# Patient Record
Sex: Female | Born: 1970 | Race: Black or African American | Hispanic: No | Marital: Single | Smoking: Current every day smoker
Health system: Southern US, Community
[De-identification: ages and names within clinical notes are randomized; demographics above are authoritative.]

## PROBLEM LIST (undated history)

## (undated) DIAGNOSIS — B20 Human immunodeficiency virus [HIV] disease: Secondary | ICD-10-CM

---

## 2012-09-26 ENCOUNTER — Encounter (HOSPITAL_COMMUNITY): Payer: Self-pay | Admitting: Emergency Medicine

## 2012-09-26 ENCOUNTER — Emergency Department (HOSPITAL_COMMUNITY)
Admission: EM | Admit: 2012-09-26 | Discharge: 2012-09-26 | Disposition: A | Discharge status: Home / Self Care | Payer: Self-pay | Attending: Emergency Medicine | Admitting: Emergency Medicine

## 2012-09-26 ENCOUNTER — Emergency Department (HOSPITAL_COMMUNITY): Payer: Self-pay

## 2012-09-26 DIAGNOSIS — Z79899 Other long term (current) drug therapy: Secondary | ICD-10-CM | POA: Insufficient documentation

## 2012-09-26 DIAGNOSIS — F17200 Nicotine dependence, unspecified, uncomplicated: Secondary | ICD-10-CM | POA: Insufficient documentation

## 2012-09-26 DIAGNOSIS — K0889 Other specified disorders of teeth and supporting structures: Secondary | ICD-10-CM

## 2012-09-26 DIAGNOSIS — S8991XA Unspecified injury of right lower leg, initial encounter: Secondary | ICD-10-CM

## 2012-09-26 DIAGNOSIS — Y929 Unspecified place or not applicable: Secondary | ICD-10-CM | POA: Insufficient documentation

## 2012-09-26 DIAGNOSIS — Y939 Activity, unspecified: Secondary | ICD-10-CM | POA: Insufficient documentation

## 2012-09-26 DIAGNOSIS — S99929A Unspecified injury of unspecified foot, initial encounter: Secondary | ICD-10-CM | POA: Insufficient documentation

## 2012-09-26 DIAGNOSIS — S8990XA Unspecified injury of unspecified lower leg, initial encounter: Secondary | ICD-10-CM | POA: Insufficient documentation

## 2012-09-26 DIAGNOSIS — K089 Disorder of teeth and supporting structures, unspecified: Secondary | ICD-10-CM | POA: Insufficient documentation

## 2012-09-26 DIAGNOSIS — W19XXXA Unspecified fall, initial encounter: Secondary | ICD-10-CM | POA: Insufficient documentation

## 2012-09-26 MED ORDER — TRAMADOL HCL 50 MG PO TABS: 50.0000 mg | ORAL_TABLET | Freq: Four times a day (QID) | ORAL | Status: DC | PRN

## 2012-09-26 MED ORDER — TRAMADOL HCL 50 MG PO TABS
50.0000 mg | ORAL_TABLET | Freq: Once | ORAL | Status: AC
  Administered 2012-09-26: 50 mg via ORAL
  Filled 2012-09-26: qty 1

## 2012-09-26 MED ORDER — PENICILLIN V POTASSIUM 500 MG PO TABS: 500.0000 mg | ORAL_TABLET | Freq: Four times a day (QID) | ORAL | Status: DC

## 2012-09-26 NOTE — ED Provider Notes (Signed)
History     CSN: [id number]  Arrival date & time 09/26/12  1044   First MD Initiated Contact with Patient 09/26/12 1208      Chief Complaint  Patient presents with  . Knee Pain  . Dental Pain    (Consider location/radiation/quality/duration/timing/severity/associated sxs/prior treatment) HPI Comments: Patient is a 42 year old female who presents with right knee pain that started last night after she fell and landed on her knee. The pain started immediately and progressively worsened since the onset. The pain is throbbing and severe without radiation. Movement and palpation makes the pain worse. No alleviating factors. Patient has not tried anything for pain. No other injuries or associated symptoms.   Patient also complains of dental pain that started gradually one month ago. The dental pain is severe, constant and progressively worsening. The pain is aching and located in right lower jaw. The pain does not radiate. Eating makes the pain worse. Nothing makes the pain better. The patient has not tried anything for pain. No associated symptoms. Patient denies headache, neck pain/stiffness, fever, NVD, edema, sore throat, throat swelling, wheezing, SOB, chest pain, abdominal pain.      History reviewed. No pertinent past medical history.  History reviewed. No pertinent past surgical history.  History reviewed. No pertinent family history.  History  Substance Use Topics  . Smoking status: Current Every Day Smoker  . Smokeless tobacco: Not on file  . Alcohol Use: Yes    OB History   Grav Para Term Preterm Abortions TAB SAB Ect Mult Living                  Review of Systems  HENT: Positive for dental problem.   Musculoskeletal: Positive for arthralgias.  All other systems reviewed and are negative.    Allergies  Review of patient's allergies indicates no known allergies.  Home Medications   Current Outpatient Rx  Name  Route  Sig  Dispense  Refill  .  efavirenz-emtricitabine-tenofovir (ATRIPLA) 600-200-300 MG per tablet   Oral   Take 1 tablet by mouth at bedtime.           BP 97/75  Pulse 70  Temp(Src) 98.1 F (36.7 C) (Oral)  Resp 18  SpO2 98%  LMP 09/17/2012  Physical Exam  Nursing note and vitals reviewed. Constitutional: She is oriented to person, place, and time. She appears well-developed and well-nourished. No distress.  HENT:  Head: Normocephalic and atraumatic.  Mouth/Throat: Oropharynx is clear and moist. No oropharyngeal exudate.  Right posterior molar tenderness to percussion. Poor dentition.  Eyes: Conjunctivae are normal.  Neck: Normal range of motion.  Cardiovascular: Normal rate and regular rhythm.  Exam reveals no gallop and no friction rub.   No murmur heard. Pulmonary/Chest: Effort normal and breath sounds normal. She has no wheezes. She has no rales. She exhibits no tenderness.  Musculoskeletal: Normal range of motion.  Medial right knee tenderness to palpation. No obvious deformity. Overlying bruising noted. Tenderness to palpation of affected area.   Neurological: She is alert and oriented to person, place, and time. Coordination normal.  Speech is goal-oriented. Moves limbs without ataxia.   Skin: Skin is warm and dry.  Psychiatric: She has a normal mood and affect. Her behavior is normal.    ED Course  Procedures (including critical care time)  Labs Reviewed - No data to display Dg Knee Complete 4 Views Right  09/26/2012   *RADIOLOGY REPORT*  Clinical Data: Medial knee pain.  Fall.  RIGHT KNEE - COMPLETE 4+ VIEW  Comparison:   None  Findings: No acute fracture or dislocation.  No joint effusion.  IMPRESSION: Normal right knee.   Original Report Authenticated By: Jeronimo Greaves, M.D.     1. Pain, dental   2. Right knee injury, initial encounter       MDM  12:24 PM Xray pending. Patient will have Tramadol for pain.   1:25 PM Xray unremarkable. Patient will be discharged with Tramadol and  Veetid for symptoms. Patient instructed to follow up with doctor and dentist from resource guide.       Emilia Beck, PA-C 09/26/12 1338

## 2012-09-26 NOTE — ED Notes (Addendum)
C/O right knee pain since falling on concrete last pm. No deformity.  ALSO, c/o dental pain "for a while.Marland KitchenMarland KitchenI need to go to a dentist".

## 2012-09-26 NOTE — ED Notes (Signed)
Pt c/o right knee pain and right sided dental pain x several days; pt sts feel x 2 days

## 2012-09-27 NOTE — ED Provider Notes (Signed)
Medical screening examination/treatment/procedure(s) were performed by non-physician practitioner and as supervising physician I was immediately available for consultation/collaboration.   Bodi Palmeri Y. Marianny Goris, MD 09/27/12 1035 

## 2013-04-05 ENCOUNTER — Emergency Department (HOSPITAL_COMMUNITY): Payer: Self-pay

## 2013-04-05 ENCOUNTER — Encounter (HOSPITAL_COMMUNITY): Payer: Self-pay | Admitting: Emergency Medicine

## 2013-04-05 ENCOUNTER — Emergency Department (HOSPITAL_COMMUNITY)
Admission: EM | Admit: 2013-04-05 | Discharge: 2013-04-05 | Disposition: A | Discharge status: Home / Self Care | Payer: Self-pay | Attending: Emergency Medicine | Admitting: Emergency Medicine

## 2013-04-05 DIAGNOSIS — Z79899 Other long term (current) drug therapy: Secondary | ICD-10-CM | POA: Insufficient documentation

## 2013-04-05 DIAGNOSIS — J189 Pneumonia, unspecified organism: Secondary | ICD-10-CM

## 2013-04-05 DIAGNOSIS — R61 Generalized hyperhidrosis: Secondary | ICD-10-CM | POA: Insufficient documentation

## 2013-04-05 DIAGNOSIS — Z21 Asymptomatic human immunodeficiency virus [HIV] infection status: Secondary | ICD-10-CM | POA: Insufficient documentation

## 2013-04-05 DIAGNOSIS — F17200 Nicotine dependence, unspecified, uncomplicated: Secondary | ICD-10-CM | POA: Insufficient documentation

## 2013-04-05 DIAGNOSIS — J159 Unspecified bacterial pneumonia: Secondary | ICD-10-CM | POA: Insufficient documentation

## 2013-04-05 HISTORY — DX: Human immunodeficiency virus (HIV) disease: B20

## 2013-04-05 LAB — BASIC METABOLIC PANEL
BUN: 11 mg/dL (ref 6–23)
CO2: 22 mEq/L (ref 19–32)
Chloride: 95 mEq/L — ABNORMAL LOW (ref 96–112)
Glucose, Bld: 124 mg/dL — ABNORMAL HIGH (ref 70–99)
Potassium: 3.9 mEq/L (ref 3.7–5.3)

## 2013-04-05 LAB — URINE MICROSCOPIC-ADD ON

## 2013-04-05 LAB — CBC
HCT: 42.9 % (ref 36.0–46.0)
Hemoglobin: 14.4 g/dL (ref 12.0–15.0)
MCHC: 33.6 g/dL (ref 30.0–36.0)
RBC: 4.42 MIL/uL (ref 3.87–5.11)
WBC: 6.2 10*3/uL (ref 4.0–10.5)

## 2013-04-05 LAB — URINALYSIS, ROUTINE W REFLEX MICROSCOPIC
Glucose, UA: NEGATIVE mg/dL
Hgb urine dipstick: NEGATIVE
Specific Gravity, Urine: 1.027 (ref 1.005–1.030)
pH: 5.5 (ref 5.0–8.0)

## 2013-04-05 MED ORDER — ACETAMINOPHEN 325 MG PO TABS
650.0000 mg | ORAL_TABLET | Freq: Four times a day (QID) | ORAL | Status: DC | PRN
  Administered 2013-04-05: 650 mg via ORAL
  Filled 2013-04-05: qty 2

## 2013-04-05 MED ORDER — AMOXICILLIN 500 MG PO CAPS: 500.0000 mg | ORAL_CAPSULE | Freq: Three times a day (TID) | ORAL | Status: DC

## 2013-04-05 MED ORDER — ALBUTEROL SULFATE (2.5 MG/3ML) 0.083% IN NEBU
5.0000 mg | INHALATION_SOLUTION | Freq: Once | RESPIRATORY_TRACT | Status: AC
  Administered 2013-04-05: 5 mg via RESPIRATORY_TRACT

## 2013-04-05 MED ORDER — AMOXICILLIN 500 MG PO CAPS
500.0000 mg | ORAL_CAPSULE | Freq: Once | ORAL | Status: AC
  Administered 2013-04-05: 500 mg via ORAL
  Filled 2013-04-05: qty 1

## 2013-04-05 MED ORDER — SODIUM CHLORIDE 0.9 % IV BOLUS (SEPSIS)
1000.0000 mL | Freq: Once | INTRAVENOUS | Status: AC
  Administered 2013-04-05: 1000 mL via INTRAVENOUS

## 2013-04-05 MED ORDER — IBUPROFEN 800 MG PO TABS
800.0000 mg | ORAL_TABLET | Freq: Once | ORAL | Status: AC
  Administered 2013-04-05: 800 mg via ORAL
  Filled 2013-04-05: qty 1

## 2013-04-05 MED ORDER — ALBUTEROL SULFATE (5 MG/ML) 0.5% IN NEBU
5.0000 mg | INHALATION_SOLUTION | Freq: Once | RESPIRATORY_TRACT | Status: DC
  Filled 2013-04-05: qty 6

## 2013-04-05 NOTE — ED Provider Notes (Signed)
CSN: [id number]     Arrival date & time 04/05/13  0055 History   None    Chief Complaint  Patient presents with  . Cough   (Consider location/radiation/quality/duration/timing/severity/associated sxs/prior Treatment) HPI History provided by pt.   Pt is HIV positive and is unsure of most recent CD4 count.  ID physician in [place].  Compliant w/ meds. Presents w/ productive cough and chest congestion x 1-2 weeks.  Associated w/ fever, chills, nasal congestion, rhinorrhea, sore throat, body aches.  Has pain in center of chest when she coughs.  Denies SOB. Her granddaughter is currently hospitalized for pneumonia.   History reviewed. No pertinent past medical history. History reviewed. No pertinent past surgical history. No family history on file. History  Substance Use Topics  . Smoking status: Current Every Day Smoker  . Smokeless tobacco: Not on file  . Alcohol Use: Yes   OB History   Grav Para Term Preterm Abortions TAB SAB Ect Mult Living                 Review of Systems  All other systems reviewed and are negative.    Allergies  Review of patient's allergies indicates no known allergies.  Home Medications   Current Outpatient Rx  Name  Route  Sig  Dispense  Refill  . abacavir (ZIAGEN) 300 MG tablet   Oral   Take 300 mg by mouth 2 (two) times daily.         Marland Kitchen elvitegravir-cobicistat-emtricitabine-tenofovir (STRIBILD) 150-150-200-300 MG TABS tablet   Oral   Take 1 tablet by mouth daily with breakfast.         . ibuprofen (ADVIL,MOTRIN) 200 MG tablet   Oral   Take 200 mg by mouth every 8 (eight) hours as needed for mild pain.          BP 108/61  Pulse 129  Temp(Src) 101.6 F (38.7 C) (Oral)  Resp 26  SpO2 95%  LMP 03/06/2013 Physical Exam  Nursing note and vitals reviewed. Constitutional: She is oriented to person, place, and time. She appears well-developed and well-nourished. No distress.  HENT:  Head: Normocephalic and atraumatic.  Erythema of  soft palate and posterior pharynx.  No tonsillar edema/exudate.  Uvula mid-line.  No trismus.  Eyes:  Normal appearance  Neck: Normal range of motion. Neck supple.  Cardiovascular: Regular rhythm.   HR 120bmp  Pulmonary/Chest: Effort normal. No respiratory distress.  Coughing.  Expiratory wheezing at lung bases that clears w/ cough.    Musculoskeletal: Normal range of motion.  Lymphadenopathy:    She has no cervical adenopathy.  Neurological: She is alert and oriented to person, place, and time.  Skin: Skin is warm. No rash noted.  diaphoretic  Psychiatric: She has a normal mood and affect. Her behavior is normal.    ED Course  Procedures (including critical care time) Labs Review Labs Reviewed  CBC  BASIC METABOLIC PANEL   Imaging Review Dg Chest 2 View  04/05/2013   CLINICAL DATA:  Cough and congestion  EXAM: CHEST  2 VIEW  COMPARISON:  None.  FINDINGS: Normal cardiac silhouette. Lungs are hyperinflated. There is a linear opacity in the right middle lobe. No pleural fluid. Lungs are hyperinflated. Chronic scarring centrally.  IMPRESSION: Right middle lobe atelectasis versus early infiltrate.  Mild emphysematous change with chronic scarring and hyperinflated lungs.   Electronically Signed   By: Genevive Bi M.D.   On: 04/05/2013 01:56    EKG Interpretation   None  MDM   1. CAP (community acquired pneumonia)    42yo HIV positive F presents w/ cough and fever.  Most recent CD4 count reviewed and was >400.  CXR shows possible early infiltrate.  Pt febrile, tachycardic, coughing, no respiratory distress, nml breath sounds on exam.  She had relief w/ breathing treatment that was ordered by triage nursing staff.  Will give her a dose of amoxicillin and ibuprofen as well as a second liter bolus of NS and then reassess.   Vital signs have improved and patient continues to feel better.  D/c'd home w/ prescription for amoxicillin and recommended rest, fluids and f/u w/ her  ID physician.  This plan was discussed w/ Dr. Preston Fleeting.     Otilio Miu, PA-C 04/05/13 [phone]

## 2013-04-05 NOTE — ED Notes (Signed)
Pt. reports persistent productive cough and chest congestion for several days .

## 2013-04-05 NOTE — ED Provider Notes (Signed)
Medical screening examination/treatment/procedure(s) were performed by non-physician practitioner and as supervising physician I was immediately available for consultation/collaboration.   Lakita Sahlin, MD 04/05/13 0827 

## 2013-04-05 NOTE — ED Notes (Signed)
[facility] arrived for pt transport 

## 2013-12-14 ENCOUNTER — Encounter (HOSPITAL_COMMUNITY): Payer: Self-pay | Admitting: Emergency Medicine

## 2013-12-14 ENCOUNTER — Emergency Department (HOSPITAL_COMMUNITY)
Admission: EM | Admit: 2013-12-14 | Discharge: 2013-12-14 | Disposition: A | Discharge status: Home / Self Care | Payer: Self-pay | Attending: Emergency Medicine | Admitting: Emergency Medicine

## 2013-12-14 DIAGNOSIS — Z21 Asymptomatic human immunodeficiency virus [HIV] infection status: Secondary | ICD-10-CM | POA: Insufficient documentation

## 2013-12-14 DIAGNOSIS — F17200 Nicotine dependence, unspecified, uncomplicated: Secondary | ICD-10-CM | POA: Insufficient documentation

## 2013-12-14 DIAGNOSIS — S01501A Unspecified open wound of lip, initial encounter: Secondary | ICD-10-CM | POA: Insufficient documentation

## 2013-12-14 DIAGNOSIS — Y9289 Other specified places as the place of occurrence of the external cause: Secondary | ICD-10-CM | POA: Insufficient documentation

## 2013-12-14 DIAGNOSIS — R296 Repeated falls: Secondary | ICD-10-CM | POA: Insufficient documentation

## 2013-12-14 DIAGNOSIS — Z23 Encounter for immunization: Secondary | ICD-10-CM | POA: Insufficient documentation

## 2013-12-14 DIAGNOSIS — Y9389 Activity, other specified: Secondary | ICD-10-CM | POA: Insufficient documentation

## 2013-12-14 DIAGNOSIS — L089 Local infection of the skin and subcutaneous tissue, unspecified: Secondary | ICD-10-CM

## 2013-12-14 DIAGNOSIS — S01511A Laceration without foreign body of lip, initial encounter: Secondary | ICD-10-CM

## 2013-12-14 MED ORDER — TETANUS-DIPHTH-ACELL PERTUSSIS 5-2.5-18.5 LF-MCG/0.5 IM SUSP
0.5000 mL | Freq: Once | INTRAMUSCULAR | Status: AC
  Administered 2013-12-14: 0.5 mL via INTRAMUSCULAR
  Filled 2013-12-14: qty 0.5

## 2013-12-14 MED ORDER — CLINDAMYCIN HCL 300 MG PO CAPS
300.0000 mg | ORAL_CAPSULE | Freq: Once | ORAL | Status: AC
  Administered 2013-12-14: 300 mg via ORAL
  Filled 2013-12-14: qty 2
  Filled 2013-12-14: qty 1

## 2013-12-14 MED ORDER — CLINDAMYCIN HCL 150 MG PO CAPS: 300.0000 mg | ORAL_CAPSULE | Freq: Three times a day (TID) | ORAL | Status: AC

## 2013-12-14 NOTE — ED Notes (Signed)
Pt fell Monday and has lac to lower lip. Lower lip is swollen. Admits to drinking beer tonight.

## 2013-12-14 NOTE — Discharge Instructions (Signed)
Recommend you apply warm compresses to promote drainage. Take antibiotics as prescribed. Do not stop antibiotics early. Take  ibuprofen for pain control as needed. Swish with water after every meal to prevent particles from becoming stuck in your lip laceration.  Open Wound, Lip An open wound is a break in the skin caused by an injury. An open wound to the lip can be a scrape, cut, or hole (puncture). Good wound care will help:   Lessen pain.  Prevent infection.  Lessen scarring. HOME CARE  Wash off all dirt.  Clean your wounds daily with gentle soap and water.  Eat soft foods or liquids while your wound is healing.  Rinse the wound with salt water after each meal.  Apply medicated cream after the wound has been cleaned as told by your doctor.  Follow up with your doctor as told. GET HELP RIGHT AWAY IF:   There is more redness or puffiness (swelling) in or around the wound.  There is increasing pain.  You have a temperature by mouth above 102 F (38.9 C), not controlled by medicine.  Your baby is older than 3 months with a rectal temperature of 102 F (38.9 C) or higher.  Your baby is 16 months old or younger with a rectal temperature of 100.4 F (38 C) or higher.  There is yellowish white fluid (pus) coming from the wound.  Very bad pain develops that is not controlled with medicine.  There is a red line on the skin above or below the wound. MAKE SURE YOU:   Understand these instructions.  Will watch this condition.  Will get help right away if you are not doing well or get worse. Document Released: 06/19/2008 Document Revised: 06/15/2011 Document Reviewed: 07/09/2009 [facility] Patient Information 2015 [place], [place]. This information is not intended to replace advice given to you by your health care provider. Make sure you discuss any questions you have with your health care provider.

## 2013-12-14 NOTE — ED Provider Notes (Signed)
CSN: [id number]     Arrival date & time 12/14/13  0101 History   First MD Initiated Contact with Patient 12/14/13 0118     Chief Complaint  Patient presents with  . Lip Laceration    (Consider location/radiation/quality/duration/timing/severity/associated sxs/prior Treatment) HPI Comments: 43 year old female presents to the emergency department for further evaluation of a laceration to her lower lip. Patient states that she was in the shower when she fell causing her 2 front teeth to bite down into her lower inner lip. Patient states that lip has become more swollen since the injury. She states that it has been draining purulent fluid. She denies any dental injury from the fall. No inability to swallow or difficulty speaking. Last tetanus unknown.  Patient is a 43 y.o. female presenting with skin laceration. The history is provided by the patient. No language interpreter was used.  Laceration Location:  Mouth Mouth laceration location:  Lower inner lip Length (cm):  1cm Depth:  Through dermis Quality: jagged   Bleeding comment:  None Time since incident:  2 days Injury mechanism: teeth through lower lip from fall. Pain details:    Quality:  Aching   Progression:  Unchanged Foreign body present:  No foreign bodies Worsened by:  Pressure Tetanus status:  Unknown   Past Medical History  Diagnosis Date  . HIV (human immunodeficiency virus infection)    History reviewed. No pertinent past surgical history. History reviewed. No pertinent family history. History  Substance Use Topics  . Smoking status: Current Every Day Smoker  . Smokeless tobacco: Not on file  . Alcohol Use: Yes   OB History   Grav Para Term Preterm Abortions TAB SAB Ect Mult Living                  Review of Systems  Constitutional: Negative for fever.  HENT: Negative for trouble swallowing.   Skin: Positive for wound.  All other systems reviewed and are negative.   Allergies  Review of patient's  allergies indicates no known allergies.  Home Medications   Prior to Admission medications   Medication Sig Start Date End Date Taking? Authorizing Provider  clindamycin (CLEOCIN) 150 MG capsule Take 2 capsules (300 mg total) by mouth 3 (three) times daily. May dispense as  capsules 12/14/13   Antony Madura, PA-C   BP 119/92  Pulse 99  Temp(Src) 98.4 F (36.9 C) (Oral)  Resp 22  Ht  (1.753 m)  Wt 125 lb (56.7 kg)  BMI 18.45 kg/m2  SpO2 95%  Physical Exam  Nursing note and vitals reviewed. Constitutional: She is oriented to person, place, and time. She appears well-developed and well-nourished. No distress.  HENT:  Head: Normocephalic and atraumatic.  Mouth/Throat: Oropharynx is clear and moist.  1cm jagged vertical laceration to lower inner lip. Mild purulence around edges of laceration as well as mild edema to lower lip surrounding laceration site. Mild erythema. No significant heat to touch. No evidence of foreign body in laceration site.  Eyes: Conjunctivae and EOM are normal. No scleral icterus.  Neck: Normal range of motion.  Pulmonary/Chest: Effort normal. No respiratory distress. She has no wheezes.  Chest expansion symmetric.  Musculoskeletal: Normal range of motion.  Neurological: She is alert and oriented to person, place, and time. She exhibits normal muscle tone. Coordination normal.  GCS 15. Patient moves extremities without ataxia.  Skin: Skin is warm and dry. No rash noted. She is not diaphoretic. No erythema. No pallor.  Psychiatric: She has  a normal mood and affect. Her behavior is normal.    ED Course  Procedures (including critical care time) Labs Review Labs Reviewed - No data to display  Imaging Review No results found.   EKG Interpretation None      MDM   Final diagnoses:  Infected lip laceration, initial encounter    43 year old female presents to the emergency department for symptoms consistent with infected lip laceration.  Laceration sustained 48 hours ago. Laceration is not amenable to closure secondary to evidence of infection and time since injury occurred. No evidence of FB in wound site. Patient afebrile, nontoxic, and nonseptic appearing. Tdap updated and will initiate tx with clindamycin. Wound care instructions and return precautions provided. Patient agreeable to plan with no unaddressed concerns.       Antony Madura, PA-C 12/14/13 [phone]

## 2013-12-14 NOTE — ED Provider Notes (Signed)
A 43 year old female fell 2 days ago injuring her lower lip. She apparently actually bit her lip and there was noted dental trauma and she denies any other foreign material being in her mouth. Her lip is now swollen and painful. There is a laceration of the mucosal surface of the lower lip and there is some purulence there and is moderately swollen. No foreign bodies seen or palpated. She will need to be placed on antibiotics.Medical screening examination/treatment/procedure(s) were conducted as a shared visit with non-physician practitioner(s) and myself.  I personally evaluated the patient during the encounter.      Dione Booze, MD 12/14/13 [phone]

## 2014-09-28 IMAGING — CR DG CHEST 2V
2 series · 2 of 2 positions shown · non-contrast
Comparison: None.

CLINICAL DATA: Cough and congestion

EXAM:
CHEST  2 VIEW

[w chest pa]
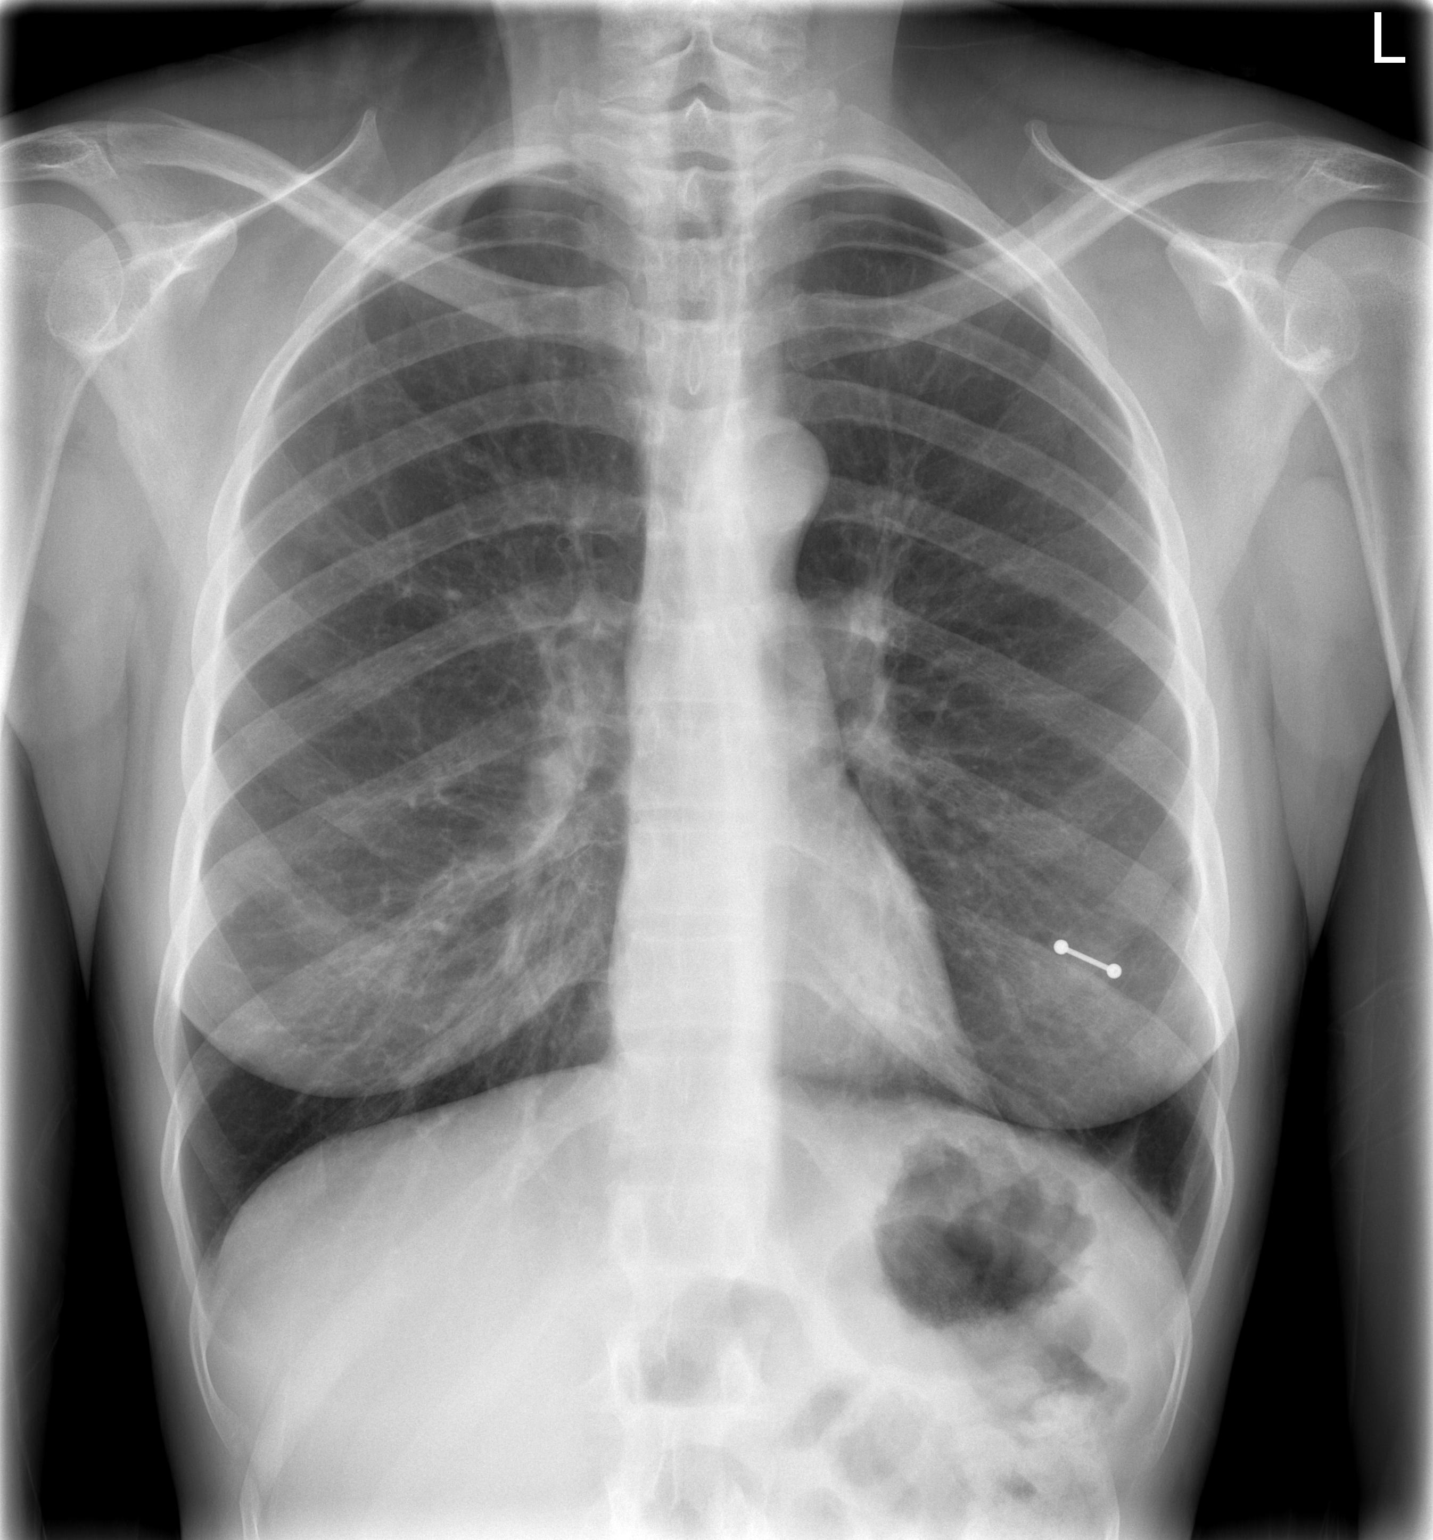

[w chest lat]
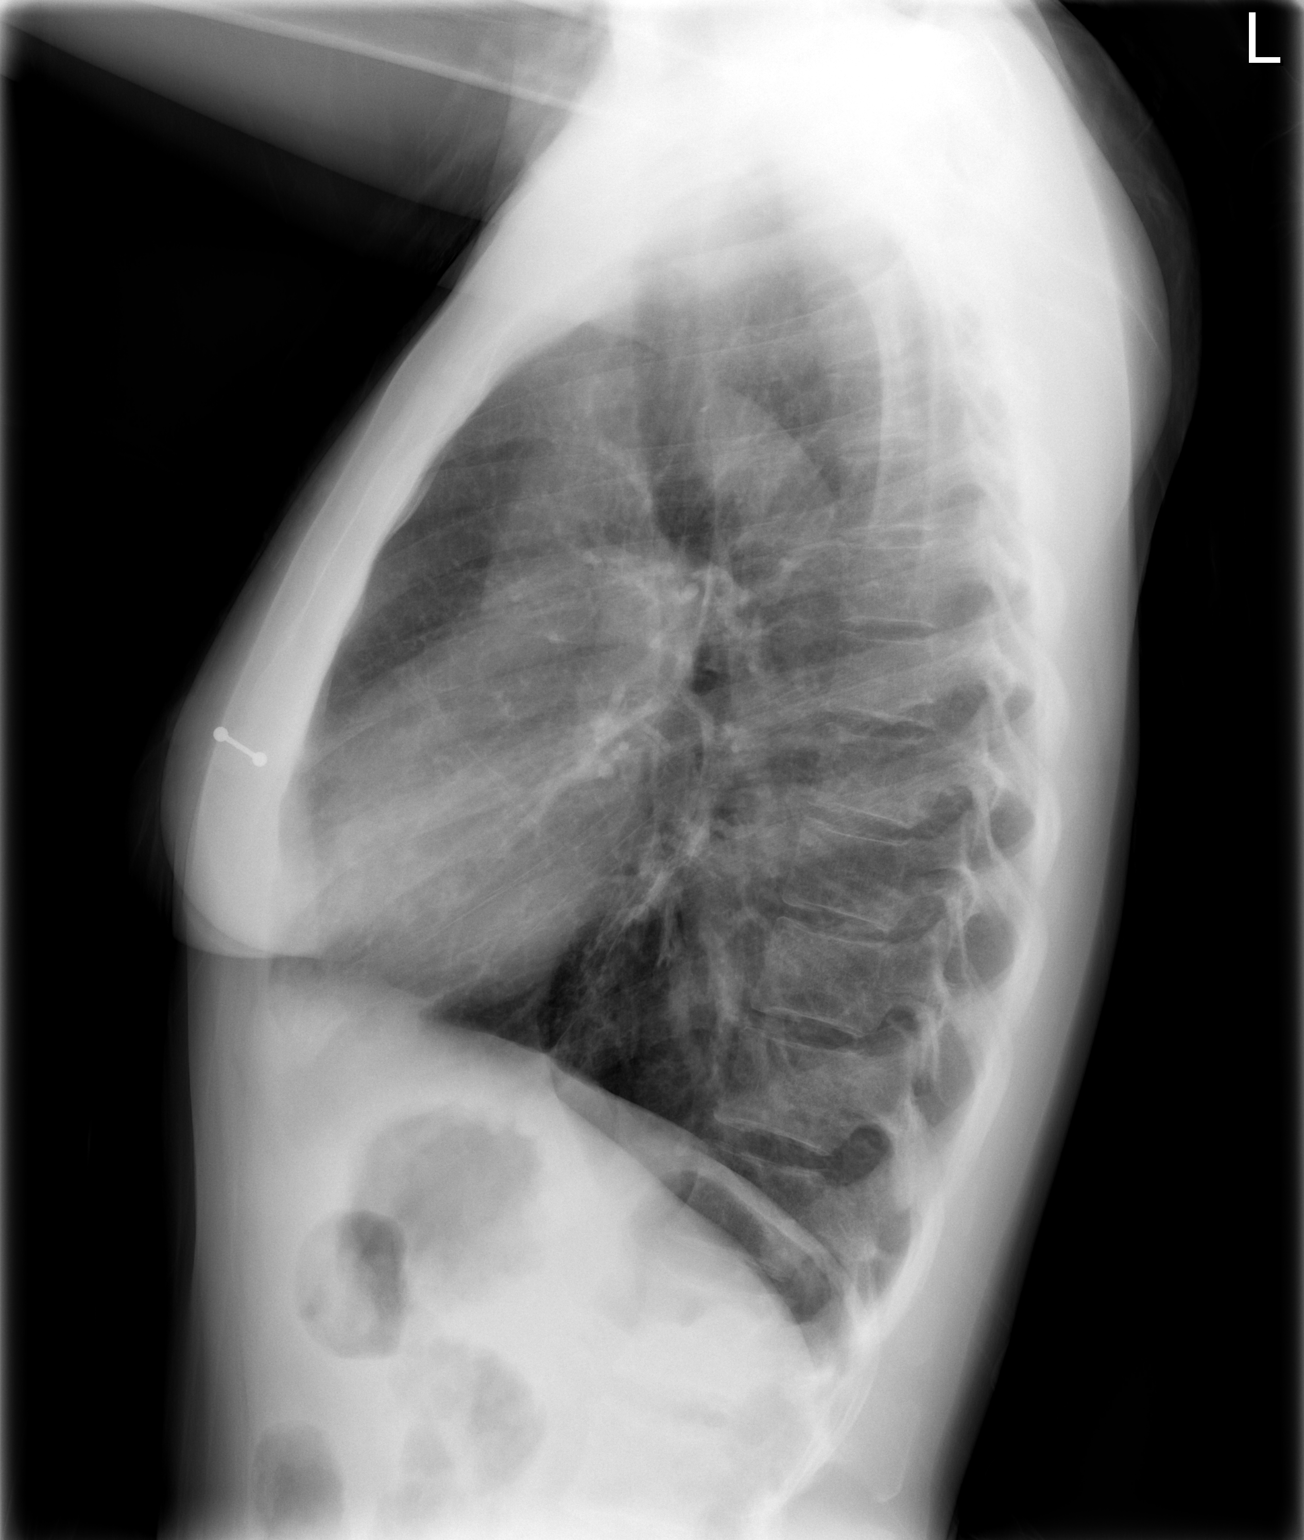

[2 of 2 positions shown; findings below may reference images not displayed]

FINDINGS: Normal cardiac silhouette. Lungs are hyperinflated. There is a
linear opacity in the right middle lobe. No pleural fluid. Lungs are
hyperinflated. Chronic scarring centrally.
IMPRESSION: Right middle lobe atelectasis versus early infiltrate.

Mild emphysematous change with chronic scarring and hyperinflated
lungs.
